# Patient Record
Sex: Male | Born: 1965
Health system: Southern US, Community
[De-identification: ages and names within clinical notes are randomized; demographics above are authoritative.]

## PROBLEM LIST (undated history)

## (undated) HISTORY — PX: OTHER SURGICAL HISTORY: SHX169

---

## 2006-06-16 ENCOUNTER — Emergency Department: Payer: Self-pay | Admitting: Emergency Medicine

## 2007-05-19 ENCOUNTER — Emergency Department: Payer: Self-pay

## 2008-11-18 ENCOUNTER — Ambulatory Visit: Payer: Self-pay | Admitting: Internal Medicine

## 2010-10-02 IMAGING — CR RIGHT HAND - 2 VIEW
1 series · 2 of 2 positions shown · non-contrast
Comparison: none

REASON FOR EXAM: foreign body
COMMENTS:

PROCEDURE:     MDR - MDR HAND RT TWO VIEWS  - November 18, 2008 [DATE]
RESULT:     No fracture, dislocation, or other acute bony normality is
identified. No radiodense soft tissue foreign body is seen.

[Series 1: view not recorded · 0.17mm/px · 2 of 2 slices shown]
[im 1/2]
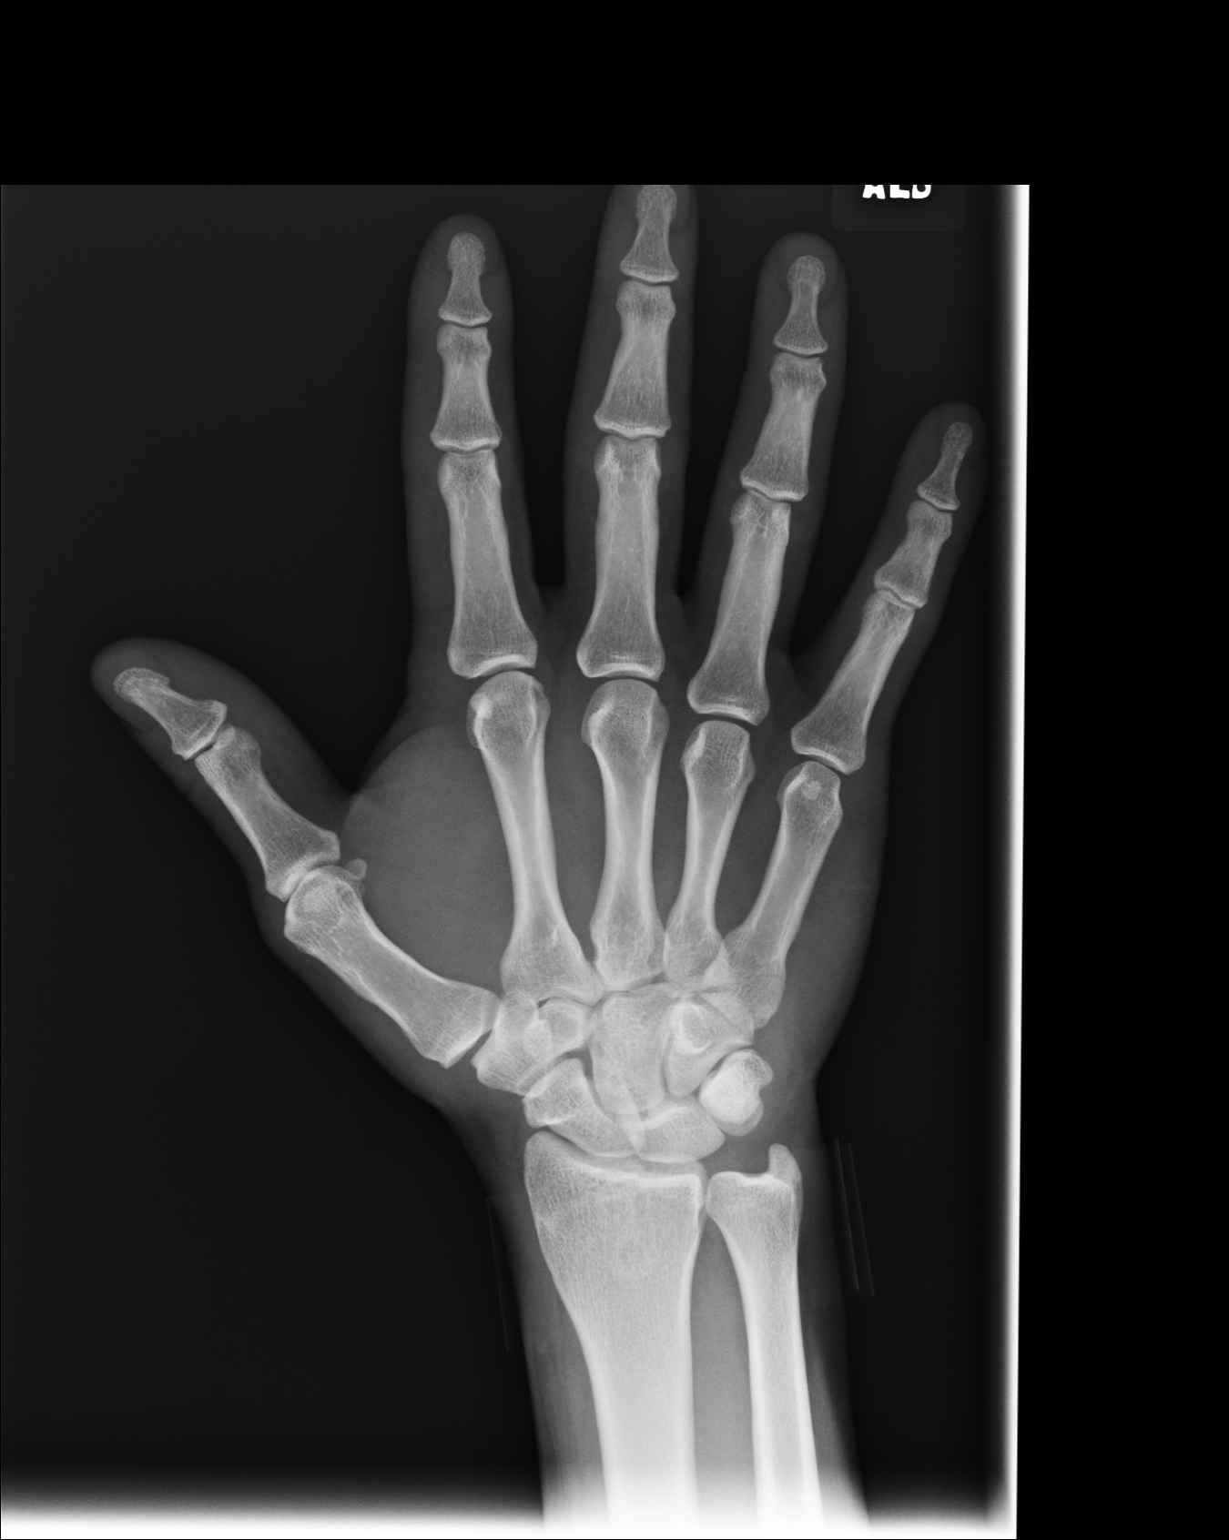
[im 2/2]
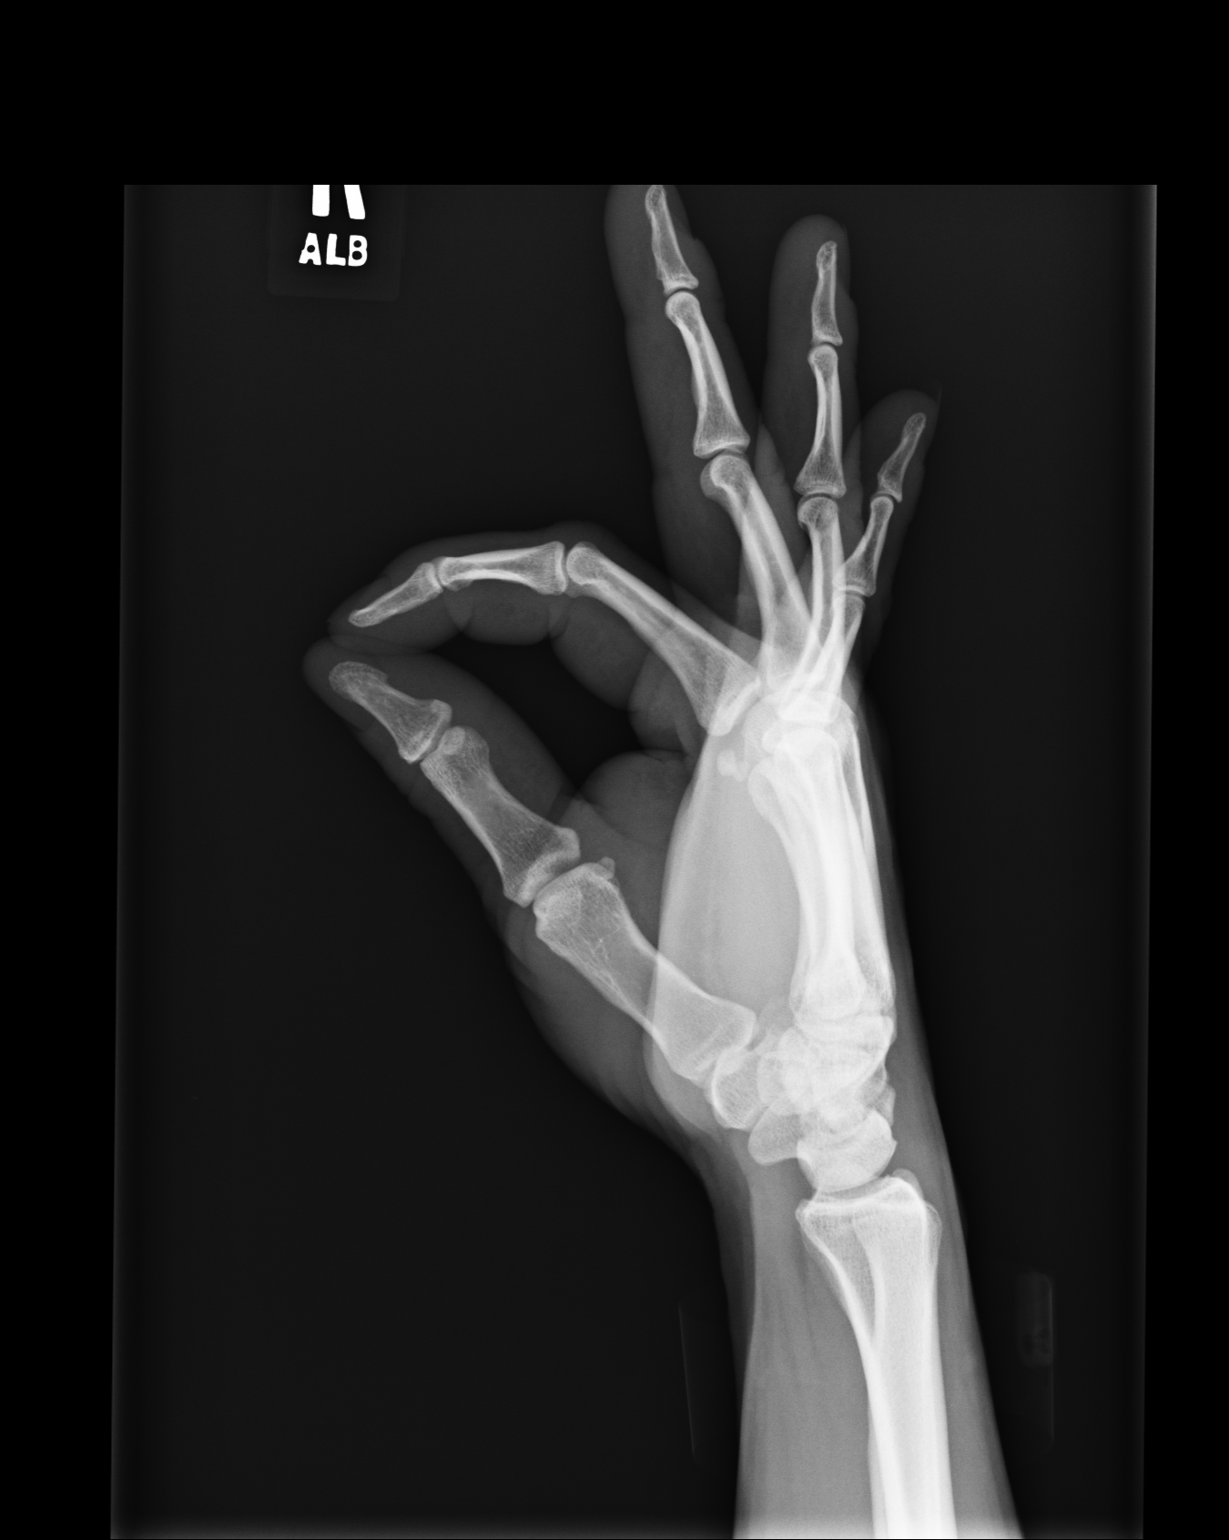

[2 of 2 positions shown; findings below may reference images not displayed]

IMPRESSION: 1. No significant abnormalities are noted.

## 2016-09-27 DIAGNOSIS — H524 Presbyopia: Secondary | ICD-10-CM | POA: Diagnosis not present

## 2016-09-27 DIAGNOSIS — H52223 Regular astigmatism, bilateral: Secondary | ICD-10-CM | POA: Diagnosis not present

## 2016-10-21 DIAGNOSIS — Z13220 Encounter for screening for lipoid disorders: Secondary | ICD-10-CM | POA: Diagnosis not present

## 2016-10-21 DIAGNOSIS — Z0000 Encounter for general adult medical examination without abnormal findings: Secondary | ICD-10-CM | POA: Diagnosis not present

## 2016-10-21 DIAGNOSIS — Z131 Encounter for screening for diabetes mellitus: Secondary | ICD-10-CM | POA: Diagnosis not present

## 2016-10-21 DIAGNOSIS — B351 Tinea unguium: Secondary | ICD-10-CM | POA: Diagnosis not present

## 2017-01-24 DIAGNOSIS — B351 Tinea unguium: Secondary | ICD-10-CM | POA: Diagnosis not present

## 2017-09-21 DIAGNOSIS — H524 Presbyopia: Secondary | ICD-10-CM | POA: Diagnosis not present

## 2017-09-21 DIAGNOSIS — H1131 Conjunctival hemorrhage, right eye: Secondary | ICD-10-CM | POA: Diagnosis not present

## 2017-09-21 DIAGNOSIS — H11151 Pinguecula, right eye: Secondary | ICD-10-CM | POA: Diagnosis not present

## 2018-09-20 DIAGNOSIS — H11151 Pinguecula, right eye: Secondary | ICD-10-CM | POA: Diagnosis not present

## 2018-09-20 DIAGNOSIS — H524 Presbyopia: Secondary | ICD-10-CM | POA: Diagnosis not present

## 2019-07-20 DIAGNOSIS — K56609 Unspecified intestinal obstruction, unspecified as to partial versus complete obstruction: Secondary | ICD-10-CM | POA: Diagnosis not present

## 2019-07-21 DIAGNOSIS — R109 Unspecified abdominal pain: Secondary | ICD-10-CM | POA: Diagnosis not present

## 2019-07-21 DIAGNOSIS — K56609 Unspecified intestinal obstruction, unspecified as to partial versus complete obstruction: Secondary | ICD-10-CM | POA: Diagnosis not present

## 2019-07-21 DIAGNOSIS — I10 Essential (primary) hypertension: Secondary | ICD-10-CM | POA: Diagnosis not present

## 2019-07-22 DIAGNOSIS — K56609 Unspecified intestinal obstruction, unspecified as to partial versus complete obstruction: Secondary | ICD-10-CM | POA: Diagnosis not present

## 2019-07-22 DIAGNOSIS — I10 Essential (primary) hypertension: Secondary | ICD-10-CM | POA: Diagnosis not present

## 2019-07-22 DIAGNOSIS — R109 Unspecified abdominal pain: Secondary | ICD-10-CM | POA: Diagnosis not present

## 2019-09-17 DIAGNOSIS — H11151 Pinguecula, right eye: Secondary | ICD-10-CM | POA: Diagnosis not present

## 2019-09-17 DIAGNOSIS — H5212 Myopia, left eye: Secondary | ICD-10-CM | POA: Diagnosis not present

## 2019-09-17 DIAGNOSIS — H524 Presbyopia: Secondary | ICD-10-CM | POA: Diagnosis not present

## 2020-07-21 ENCOUNTER — Other Ambulatory Visit: Payer: Self-pay | Admitting: Internal Medicine

## 2020-07-21 ENCOUNTER — Ambulatory Visit: Payer: Self-pay | Attending: Internal Medicine

## 2020-07-21 DIAGNOSIS — Z23 Encounter for immunization: Secondary | ICD-10-CM

## 2020-07-21 NOTE — Progress Notes (Signed)
   Covid-19 Vaccination Clinic  Name:  Zachary Chen    MRN: [id number] DOB: 11-Feb-1966  07/21/2020  Mr. Zachary Chen was observed post Covid-19 immunization for 15 minutes without incident. He was provided with Vaccine Information Sheet and instruction to access the V-Safe system.   Mr. Zachary Chen was instructed to call 911 with any severe reactions post vaccine: Marland Kitchen Difficulty breathing  . Swelling of face and throat  . A fast heartbeat  . A bad rash all over body  . Dizziness and weakness   Immunizations Administered    Name Date Dose VIS Date Route   Pfizer COVID-19 Vaccine 07/21/2020  9:13 AM 0.3 mL 12/11/2018 Intramuscular   Manufacturer: [employer], [employer]   Lot: [id number]   NDC: [address]-4

## 2020-08-11 ENCOUNTER — Other Ambulatory Visit: Payer: Self-pay | Admitting: Internal Medicine

## 2020-08-11 ENCOUNTER — Ambulatory Visit: Payer: Self-pay | Attending: Internal Medicine

## 2020-08-11 DIAGNOSIS — Z23 Encounter for immunization: Secondary | ICD-10-CM

## 2020-08-11 NOTE — Progress Notes (Signed)
   Covid-19 Vaccination Clinic  Name:  Zachary Chen    MRN: [id number] DOB: 1965-11-22  08/11/2020  [name] was observed post Covid-19 immunization for 15 minutes without incident. He was provided with Vaccine Information Sheet and instruction to access the V-Safe system.   [name] was instructed to call 911 with any severe reactions post vaccine: Marland Kitchen Difficulty breathing  . Swelling of face and throat  . A fast heartbeat  . A bad rash all over body  . Dizziness and weakness   Immunizations Administered    Name Date Dose VIS Date Route   Pfizer COVID-19 Vaccine 08/11/2020  9:25 AM 0.3 mL 12/11/2018 Intramuscular   Manufacturer: [employer], [employer]   Lot: [id number]   NDC: [address]-5

## 2020-09-17 DIAGNOSIS — H11153 Pinguecula, bilateral: Secondary | ICD-10-CM | POA: Diagnosis not present

## 2020-09-17 DIAGNOSIS — H524 Presbyopia: Secondary | ICD-10-CM | POA: Diagnosis not present

## 2020-09-17 DIAGNOSIS — H5212 Myopia, left eye: Secondary | ICD-10-CM | POA: Diagnosis not present

## 2021-09-16 DIAGNOSIS — H5201 Hypermetropia, right eye: Secondary | ICD-10-CM | POA: Diagnosis not present

## 2021-09-16 DIAGNOSIS — H524 Presbyopia: Secondary | ICD-10-CM | POA: Diagnosis not present

## 2021-09-16 DIAGNOSIS — H5212 Myopia, left eye: Secondary | ICD-10-CM | POA: Diagnosis not present

## 2021-09-16 DIAGNOSIS — H11153 Pinguecula, bilateral: Secondary | ICD-10-CM | POA: Diagnosis not present

## 2022-06-15 DIAGNOSIS — M542 Cervicalgia: Secondary | ICD-10-CM | POA: Diagnosis not present

## 2022-06-15 DIAGNOSIS — M5412 Radiculopathy, cervical region: Secondary | ICD-10-CM | POA: Diagnosis not present

## 2022-06-15 DIAGNOSIS — M9901 Segmental and somatic dysfunction of cervical region: Secondary | ICD-10-CM | POA: Diagnosis not present

## 2022-06-15 DIAGNOSIS — M62830 Muscle spasm of back: Secondary | ICD-10-CM | POA: Diagnosis not present

## 2022-06-16 DIAGNOSIS — M62830 Muscle spasm of back: Secondary | ICD-10-CM | POA: Diagnosis not present

## 2022-06-16 DIAGNOSIS — M542 Cervicalgia: Secondary | ICD-10-CM | POA: Diagnosis not present

## 2022-06-16 DIAGNOSIS — M5412 Radiculopathy, cervical region: Secondary | ICD-10-CM | POA: Diagnosis not present

## 2022-06-16 DIAGNOSIS — M9901 Segmental and somatic dysfunction of cervical region: Secondary | ICD-10-CM | POA: Diagnosis not present

## 2022-06-17 DIAGNOSIS — M5412 Radiculopathy, cervical region: Secondary | ICD-10-CM | POA: Diagnosis not present

## 2022-06-17 DIAGNOSIS — M9901 Segmental and somatic dysfunction of cervical region: Secondary | ICD-10-CM | POA: Diagnosis not present

## 2022-06-17 DIAGNOSIS — M62830 Muscle spasm of back: Secondary | ICD-10-CM | POA: Diagnosis not present

## 2022-06-17 DIAGNOSIS — M542 Cervicalgia: Secondary | ICD-10-CM | POA: Diagnosis not present

## 2022-06-21 DIAGNOSIS — M62830 Muscle spasm of back: Secondary | ICD-10-CM | POA: Diagnosis not present

## 2022-06-21 DIAGNOSIS — M542 Cervicalgia: Secondary | ICD-10-CM | POA: Diagnosis not present

## 2022-06-21 DIAGNOSIS — M9901 Segmental and somatic dysfunction of cervical region: Secondary | ICD-10-CM | POA: Diagnosis not present

## 2022-06-21 DIAGNOSIS — M5412 Radiculopathy, cervical region: Secondary | ICD-10-CM | POA: Diagnosis not present

## 2022-06-22 DIAGNOSIS — M542 Cervicalgia: Secondary | ICD-10-CM | POA: Diagnosis not present

## 2022-06-22 DIAGNOSIS — M9901 Segmental and somatic dysfunction of cervical region: Secondary | ICD-10-CM | POA: Diagnosis not present

## 2022-06-22 DIAGNOSIS — M62830 Muscle spasm of back: Secondary | ICD-10-CM | POA: Diagnosis not present

## 2022-06-22 DIAGNOSIS — M5412 Radiculopathy, cervical region: Secondary | ICD-10-CM | POA: Diagnosis not present

## 2022-06-24 DIAGNOSIS — M542 Cervicalgia: Secondary | ICD-10-CM | POA: Diagnosis not present

## 2022-06-24 DIAGNOSIS — M62830 Muscle spasm of back: Secondary | ICD-10-CM | POA: Diagnosis not present

## 2022-06-24 DIAGNOSIS — M5412 Radiculopathy, cervical region: Secondary | ICD-10-CM | POA: Diagnosis not present

## 2022-06-24 DIAGNOSIS — M9901 Segmental and somatic dysfunction of cervical region: Secondary | ICD-10-CM | POA: Diagnosis not present

## 2022-06-27 DIAGNOSIS — M62830 Muscle spasm of back: Secondary | ICD-10-CM | POA: Diagnosis not present

## 2022-06-27 DIAGNOSIS — M9901 Segmental and somatic dysfunction of cervical region: Secondary | ICD-10-CM | POA: Diagnosis not present

## 2022-06-27 DIAGNOSIS — M542 Cervicalgia: Secondary | ICD-10-CM | POA: Diagnosis not present

## 2022-06-27 DIAGNOSIS — M5412 Radiculopathy, cervical region: Secondary | ICD-10-CM | POA: Diagnosis not present

## 2022-06-29 DIAGNOSIS — M9901 Segmental and somatic dysfunction of cervical region: Secondary | ICD-10-CM | POA: Diagnosis not present

## 2022-06-29 DIAGNOSIS — M5412 Radiculopathy, cervical region: Secondary | ICD-10-CM | POA: Diagnosis not present

## 2022-06-29 DIAGNOSIS — M62830 Muscle spasm of back: Secondary | ICD-10-CM | POA: Diagnosis not present

## 2022-06-29 DIAGNOSIS — M542 Cervicalgia: Secondary | ICD-10-CM | POA: Diagnosis not present

## 2022-07-01 DIAGNOSIS — S0181XA Laceration without foreign body of other part of head, initial encounter: Secondary | ICD-10-CM | POA: Diagnosis not present

## 2022-07-01 DIAGNOSIS — S50311A Abrasion of right elbow, initial encounter: Secondary | ICD-10-CM | POA: Diagnosis not present

## 2022-07-01 DIAGNOSIS — R000 Tachycardia, unspecified: Secondary | ICD-10-CM | POA: Diagnosis not present

## 2022-07-01 DIAGNOSIS — R220 Localized swelling, mass and lump, head: Secondary | ICD-10-CM | POA: Diagnosis not present

## 2022-07-05 DIAGNOSIS — M62830 Muscle spasm of back: Secondary | ICD-10-CM | POA: Diagnosis not present

## 2022-07-05 DIAGNOSIS — M5412 Radiculopathy, cervical region: Secondary | ICD-10-CM | POA: Diagnosis not present

## 2022-07-05 DIAGNOSIS — M9901 Segmental and somatic dysfunction of cervical region: Secondary | ICD-10-CM | POA: Diagnosis not present

## 2022-07-05 DIAGNOSIS — M542 Cervicalgia: Secondary | ICD-10-CM | POA: Diagnosis not present

## 2022-07-06 DIAGNOSIS — M62830 Muscle spasm of back: Secondary | ICD-10-CM | POA: Diagnosis not present

## 2022-07-06 DIAGNOSIS — M9901 Segmental and somatic dysfunction of cervical region: Secondary | ICD-10-CM | POA: Diagnosis not present

## 2022-07-06 DIAGNOSIS — M542 Cervicalgia: Secondary | ICD-10-CM | POA: Diagnosis not present

## 2022-07-06 DIAGNOSIS — M5412 Radiculopathy, cervical region: Secondary | ICD-10-CM | POA: Diagnosis not present

## 2022-07-08 DIAGNOSIS — M5412 Radiculopathy, cervical region: Secondary | ICD-10-CM | POA: Diagnosis not present

## 2022-07-08 DIAGNOSIS — M62830 Muscle spasm of back: Secondary | ICD-10-CM | POA: Diagnosis not present

## 2022-07-08 DIAGNOSIS — M542 Cervicalgia: Secondary | ICD-10-CM | POA: Diagnosis not present

## 2022-07-08 DIAGNOSIS — M9901 Segmental and somatic dysfunction of cervical region: Secondary | ICD-10-CM | POA: Diagnosis not present

## 2022-07-12 DIAGNOSIS — M5412 Radiculopathy, cervical region: Secondary | ICD-10-CM | POA: Diagnosis not present

## 2022-07-12 DIAGNOSIS — M9901 Segmental and somatic dysfunction of cervical region: Secondary | ICD-10-CM | POA: Diagnosis not present

## 2022-07-12 DIAGNOSIS — M542 Cervicalgia: Secondary | ICD-10-CM | POA: Diagnosis not present

## 2022-07-12 DIAGNOSIS — M62830 Muscle spasm of back: Secondary | ICD-10-CM | POA: Diagnosis not present

## 2022-07-14 DIAGNOSIS — M5412 Radiculopathy, cervical region: Secondary | ICD-10-CM | POA: Diagnosis not present

## 2022-07-14 DIAGNOSIS — M62830 Muscle spasm of back: Secondary | ICD-10-CM | POA: Diagnosis not present

## 2022-07-14 DIAGNOSIS — M542 Cervicalgia: Secondary | ICD-10-CM | POA: Diagnosis not present

## 2022-07-14 DIAGNOSIS — M9901 Segmental and somatic dysfunction of cervical region: Secondary | ICD-10-CM | POA: Diagnosis not present

## 2022-07-25 DIAGNOSIS — M62830 Muscle spasm of back: Secondary | ICD-10-CM | POA: Diagnosis not present

## 2022-07-25 DIAGNOSIS — M542 Cervicalgia: Secondary | ICD-10-CM | POA: Diagnosis not present

## 2022-07-25 DIAGNOSIS — M9901 Segmental and somatic dysfunction of cervical region: Secondary | ICD-10-CM | POA: Diagnosis not present

## 2022-07-25 DIAGNOSIS — M5412 Radiculopathy, cervical region: Secondary | ICD-10-CM | POA: Diagnosis not present

## 2022-08-01 DIAGNOSIS — M5412 Radiculopathy, cervical region: Secondary | ICD-10-CM | POA: Diagnosis not present

## 2022-08-01 DIAGNOSIS — M62830 Muscle spasm of back: Secondary | ICD-10-CM | POA: Diagnosis not present

## 2022-08-01 DIAGNOSIS — M9901 Segmental and somatic dysfunction of cervical region: Secondary | ICD-10-CM | POA: Diagnosis not present

## 2022-08-01 DIAGNOSIS — M542 Cervicalgia: Secondary | ICD-10-CM | POA: Diagnosis not present

## 2022-08-04 DIAGNOSIS — M5412 Radiculopathy, cervical region: Secondary | ICD-10-CM | POA: Diagnosis not present

## 2022-08-04 DIAGNOSIS — M9901 Segmental and somatic dysfunction of cervical region: Secondary | ICD-10-CM | POA: Diagnosis not present

## 2022-08-04 DIAGNOSIS — M62830 Muscle spasm of back: Secondary | ICD-10-CM | POA: Diagnosis not present

## 2022-08-04 DIAGNOSIS — M542 Cervicalgia: Secondary | ICD-10-CM | POA: Diagnosis not present

## 2022-08-08 DIAGNOSIS — M62830 Muscle spasm of back: Secondary | ICD-10-CM | POA: Diagnosis not present

## 2022-08-08 DIAGNOSIS — M542 Cervicalgia: Secondary | ICD-10-CM | POA: Diagnosis not present

## 2022-08-08 DIAGNOSIS — M5412 Radiculopathy, cervical region: Secondary | ICD-10-CM | POA: Diagnosis not present

## 2022-08-08 DIAGNOSIS — M9901 Segmental and somatic dysfunction of cervical region: Secondary | ICD-10-CM | POA: Diagnosis not present

## 2022-08-22 DIAGNOSIS — M5412 Radiculopathy, cervical region: Secondary | ICD-10-CM | POA: Diagnosis not present

## 2022-08-22 DIAGNOSIS — M62830 Muscle spasm of back: Secondary | ICD-10-CM | POA: Diagnosis not present

## 2022-08-22 DIAGNOSIS — M9901 Segmental and somatic dysfunction of cervical region: Secondary | ICD-10-CM | POA: Diagnosis not present

## 2022-08-22 DIAGNOSIS — M542 Cervicalgia: Secondary | ICD-10-CM | POA: Diagnosis not present

## 2022-08-23 DIAGNOSIS — H5201 Hypermetropia, right eye: Secondary | ICD-10-CM | POA: Diagnosis not present

## 2022-08-23 DIAGNOSIS — H11153 Pinguecula, bilateral: Secondary | ICD-10-CM | POA: Diagnosis not present

## 2022-08-23 DIAGNOSIS — H5212 Myopia, left eye: Secondary | ICD-10-CM | POA: Diagnosis not present

## 2022-08-23 DIAGNOSIS — H524 Presbyopia: Secondary | ICD-10-CM | POA: Diagnosis not present

## 2022-09-05 DIAGNOSIS — M62830 Muscle spasm of back: Secondary | ICD-10-CM | POA: Diagnosis not present

## 2022-09-05 DIAGNOSIS — M542 Cervicalgia: Secondary | ICD-10-CM | POA: Diagnosis not present

## 2022-09-05 DIAGNOSIS — M9901 Segmental and somatic dysfunction of cervical region: Secondary | ICD-10-CM | POA: Diagnosis not present

## 2022-09-05 DIAGNOSIS — M5412 Radiculopathy, cervical region: Secondary | ICD-10-CM | POA: Diagnosis not present

## 2022-09-19 DIAGNOSIS — M542 Cervicalgia: Secondary | ICD-10-CM | POA: Diagnosis not present

## 2022-09-19 DIAGNOSIS — M9901 Segmental and somatic dysfunction of cervical region: Secondary | ICD-10-CM | POA: Diagnosis not present

## 2022-09-19 DIAGNOSIS — M62830 Muscle spasm of back: Secondary | ICD-10-CM | POA: Diagnosis not present

## 2022-09-19 DIAGNOSIS — M5412 Radiculopathy, cervical region: Secondary | ICD-10-CM | POA: Diagnosis not present

## 2022-09-29 DIAGNOSIS — M62830 Muscle spasm of back: Secondary | ICD-10-CM | POA: Diagnosis not present

## 2022-09-29 DIAGNOSIS — M542 Cervicalgia: Secondary | ICD-10-CM | POA: Diagnosis not present

## 2022-09-29 DIAGNOSIS — M5412 Radiculopathy, cervical region: Secondary | ICD-10-CM | POA: Diagnosis not present

## 2022-09-29 DIAGNOSIS — M9901 Segmental and somatic dysfunction of cervical region: Secondary | ICD-10-CM | POA: Diagnosis not present

## 2023-10-31 ENCOUNTER — Encounter: Payer: Self-pay | Admitting: Internal Medicine

## 2023-10-31 ENCOUNTER — Ambulatory Visit: Payer: Commercial Managed Care - PPO | Admitting: Internal Medicine

## 2023-10-31 VITALS — BP 120/80 | HR 58 | Temp 97.2°F | Ht 66.0 in | Wt 180.6 lb

## 2023-10-31 DIAGNOSIS — Z0001 Encounter for general adult medical examination with abnormal findings: Secondary | ICD-10-CM

## 2023-10-31 DIAGNOSIS — Z1211 Encounter for screening for malignant neoplasm of colon: Secondary | ICD-10-CM | POA: Diagnosis not present

## 2023-10-31 DIAGNOSIS — N400 Enlarged prostate without lower urinary tract symptoms: Secondary | ICD-10-CM

## 2023-10-31 DIAGNOSIS — Z0130 Encounter for examination of blood pressure without abnormal findings: Secondary | ICD-10-CM

## 2023-10-31 NOTE — Progress Notes (Signed)
 New Patient Office Visit  Subjective:  Patient ID: Zachary Chen, male    DOB: 1966/09/03  Age: 58 y.o. MRN: [id number]  Chief Complaint  Patient presents with   Establish Care    No complaints, here to establish IM care. Hasn't seen a pcp in 7 years but was not on prescription rx then.     No other concerns at this time.   History reviewed. No pertinent past medical history.  Past Surgical History:  Procedure Laterality Date   partial amputation of left finger Left     Social History   Socioeconomic History   Marital status: Married    Spouse name: Not on file   Number of children: Not on file   Years of education: Not on file   Highest education level: Not on file  Occupational History   Occupation: corporate investment banker  Tobacco Use   Smoking status: Never   Smokeless tobacco: Never  Vaping Use   Vaping status: Never Used  Substance and Sexual Activity   Alcohol use: Yes    Comment: occasionally   Drug use: Never   Sexual activity: Yes  Other Topics Concern   Not on file  Social History Narrative   Not on file   Social Drivers of Health   Financial Resource Strain: Not on file  Food Insecurity: Not on file  Transportation Needs: Not on file  Physical Activity: Not on file  Stress: Not on file  Social Connections: Not on file  Intimate Partner Violence: Not on file    History reviewed. No pertinent family history.  Not on File  No outpatient medications prior to visit.   No facility-administered medications prior to visit.    Review of Systems  Constitutional:  Negative for malaise/fatigue and weight loss.  HENT: Negative.    Eyes:  Negative for blurred vision (except near vision).  Respiratory: Negative.    Cardiovascular: Negative.  Negative for chest pain.  Gastrointestinal: Negative.   Genitourinary: Negative.   Musculoskeletal:  Positive for joint pain (both shoulders).  Skin:        Skin tags  Neurological: Negative.    Psychiatric/Behavioral: Negative.         Objective:   BP 120/80   Pulse (!) 58   Temp (!) 97.2 F (36.2 C)   Ht 5' 6 (1.676 m)   Wt 180 lb 9.6 oz (81.9 kg)   SpO2 95%   BMI 29.15 kg/m   Vitals:   10/31/23 0943  BP: 120/80  Pulse: (!) 58  Temp: (!) 97.2 F (36.2 C)  Height: 5' 6 (1.676 m)  Weight: 180 lb 9.6 oz (81.9 kg)  SpO2: 95%  BMI (Calculated): 29.16    Physical Exam Vitals reviewed.  Constitutional:      Appearance: Normal appearance.  HENT:     Head: Normocephalic.     Left Ear: There is no impacted cerumen.     Nose: Nose normal.     Mouth/Throat:     Mouth: Mucous membranes are moist.     Pharynx: No posterior oropharyngeal erythema.  Eyes:     Extraocular Movements: Extraocular movements intact.     Pupils: Pupils are equal, round, and reactive to light.  Cardiovascular:     Rate and Rhythm: Regular rhythm.     Chest Wall: PMI is not displaced.     Pulses: Normal pulses.     Heart sounds: Normal heart sounds. No murmur heard. Pulmonary:     Effort:  Pulmonary effort is normal.     Breath sounds: Normal air entry. No rhonchi or rales.  Abdominal:     General: Abdomen is flat. Bowel sounds are normal. There is no distension.     Palpations: Abdomen is soft. There is no hepatomegaly, splenomegaly or mass.     Tenderness: There is no abdominal tenderness.  Musculoskeletal:        General: Normal range of motion.     Cervical back: Normal range of motion and neck supple.     Right lower leg: No edema.     Left lower leg: No edema.  Skin:    General: Skin is warm and dry.  Neurological:     General: No focal deficit present.     Mental Status: He is alert and oriented to person, place, and time.     Cranial Nerves: No cranial nerve deficit.     Motor: No weakness.     Deep Tendon Reflexes: Reflexes are normal and symmetric.  Psychiatric:        Mood and Affect: Mood normal.        Behavior: Behavior normal.      No results found for  any visits on 10/31/23.  No results found for this or any previous visit (from the past 2160 hours).    Assessment & Plan:  As per problem list.  Problem List Items Addressed This Visit   None Visit Diagnoses       Encounter for routine adult health examination with abnormal findings    -  Primary   Relevant Orders   Lipid panel   Comprehensive metabolic panel   CBC With Diff/Platelet   POC Hemoccult Bld/Stl (1-Cd Office Dx)     Benign prostatic hyperplasia without lower urinary tract symptoms       Relevant Orders   PSA     Colon cancer screening       Relevant Orders   Ambulatory referral to Gastroenterology       Return in about 3 weeks (around 11/21/2023) for cpe with labs prior.   Total time spent: 40 minutes  Sherrill Cinderella Perry, MD  10/31/2023   This document may have been prepared by [facility] Voice Recognition software and as such may include unintentional dictation errors.

## 2023-11-02 DIAGNOSIS — M542 Cervicalgia: Secondary | ICD-10-CM | POA: Diagnosis not present

## 2023-11-02 DIAGNOSIS — M62830 Muscle spasm of back: Secondary | ICD-10-CM | POA: Diagnosis not present

## 2023-11-02 DIAGNOSIS — M5412 Radiculopathy, cervical region: Secondary | ICD-10-CM | POA: Diagnosis not present

## 2023-11-02 DIAGNOSIS — M9901 Segmental and somatic dysfunction of cervical region: Secondary | ICD-10-CM | POA: Diagnosis not present

## 2023-11-20 DIAGNOSIS — M542 Cervicalgia: Secondary | ICD-10-CM | POA: Diagnosis not present

## 2023-11-20 DIAGNOSIS — M62830 Muscle spasm of back: Secondary | ICD-10-CM | POA: Diagnosis not present

## 2023-11-20 DIAGNOSIS — M5412 Radiculopathy, cervical region: Secondary | ICD-10-CM | POA: Diagnosis not present

## 2023-11-20 DIAGNOSIS — M9901 Segmental and somatic dysfunction of cervical region: Secondary | ICD-10-CM | POA: Diagnosis not present

## 2023-11-27 ENCOUNTER — Encounter: Payer: Commercial Managed Care - PPO | Admitting: Internal Medicine

## 2023-12-04 ENCOUNTER — Encounter: Payer: Self-pay | Admitting: Internal Medicine

## 2023-12-04 ENCOUNTER — Ambulatory Visit (INDEPENDENT_AMBULATORY_CARE_PROVIDER_SITE_OTHER): Payer: Commercial Managed Care - PPO | Admitting: Internal Medicine

## 2023-12-04 VITALS — BP 116/70 | HR 81 | Temp 97.3°F | Ht 66.0 in | Wt 180.0 lb

## 2023-12-04 DIAGNOSIS — M25511 Pain in right shoulder: Secondary | ICD-10-CM | POA: Insufficient documentation

## 2023-12-04 DIAGNOSIS — B351 Tinea unguium: Secondary | ICD-10-CM | POA: Insufficient documentation

## 2023-12-04 DIAGNOSIS — Z0001 Encounter for general adult medical examination with abnormal findings: Secondary | ICD-10-CM | POA: Diagnosis not present

## 2023-12-04 DIAGNOSIS — Z0130 Encounter for examination of blood pressure without abnormal findings: Secondary | ICD-10-CM

## 2023-12-04 DIAGNOSIS — M25512 Pain in left shoulder: Secondary | ICD-10-CM

## 2023-12-04 MED ORDER — DICLOFENAC SODIUM 1 % EX GEL: 2.0000 g | Freq: Two times a day (BID) | CUTANEOUS | 1 refills | Status: AC

## 2023-12-04 NOTE — Progress Notes (Signed)
 Established Patient Office Visit  Subjective:  Patient ID: Zachary Chen, male    DOB: [birth year]  Age: 58 y.o. MRN: [id number]  Chief Complaint  Patient presents with   Annual Exam    3 week follow up  CPE    No new complaints, here for CPE, lab review and medication refills. Failed to have previsit labs done.    No other concerns at this time.   No past medical history on file.  Past Surgical History:  Procedure Laterality Date   partial amputation of left finger Left     Social History   Socioeconomic History   Marital status: Married    Spouse name: Not on file   Number of children: Not on file   Years of education: Not on file   Highest education level: Not on file  Occupational History   Occupation: Corporate investment banker  Tobacco Use   Smoking status: Never   Smokeless tobacco: Never  Vaping Use   Vaping status: Never Used  Substance and Sexual Activity   Alcohol use: Yes    Comment: occasionally   Drug use: Never   Sexual activity: Yes  Other Topics Concern   Not on file  Social History Narrative   Not on file   Social Drivers of Health   Financial Resource Strain: Not on file  Food Insecurity: Not on file  Transportation Needs: Not on file  Physical Activity: Not on file  Stress: Not on file  Social Connections: Not on file  Intimate Partner Violence: Not on file    No family history on file.  Not on File  No outpatient medications prior to visit.   No facility-administered medications prior to visit.    Review of Systems  Constitutional:  Negative for malaise/fatigue and weight loss.  HENT: Negative.    Eyes:  Negative for blurred vision (except near vision).  Respiratory: Negative.    Cardiovascular: Negative.  Negative for chest pain.  Gastrointestinal: Negative.   Genitourinary: Negative.   Musculoskeletal:  Positive for joint pain (both shoulders).  Skin:        Skin tags  Neurological: Negative.   Psychiatric/Behavioral:  Negative.         Objective:   BP 116/70   Pulse 81   Temp (!) 97.3 F (36.3 C)   Ht 5\' 6"  (1.676 m)   Wt 180 lb (81.6 kg)   SpO2 98%   BMI 29.05 kg/m   Vitals:   12/04/23 1419  BP: 116/70  Pulse: 81  Temp: (!) 97.3 F (36.3 C)  Height: 5\' 6"  (1.676 m)  Weight: 180 lb (81.6 kg)  SpO2: 98%  BMI (Calculated): 29.07    Physical Exam Vitals reviewed.  Constitutional:      Appearance: Normal appearance.  HENT:     Head: Normocephalic.     Left Ear: There is no impacted cerumen.     Nose: Nose normal.     Mouth/Throat:     Mouth: Mucous membranes are moist.     Pharynx: No posterior oropharyngeal erythema.  Eyes:     Extraocular Movements: Extraocular movements intact.     Pupils: Pupils are equal, round, and reactive to light.  Cardiovascular:     Rate and Rhythm: Regular rhythm.     Chest Wall: PMI is not displaced.     Pulses: Normal pulses.     Heart sounds: Normal heart sounds. No murmur heard. Pulmonary:     Effort: Pulmonary effort is normal.  Breath sounds: Normal air entry. No rhonchi or rales.  Abdominal:     General: Abdomen is flat. Bowel sounds are normal. There is no distension.     Palpations: Abdomen is soft. There is no hepatomegaly, splenomegaly or mass.     Tenderness: There is no abdominal tenderness.  Musculoskeletal:        General: Normal range of motion.     Cervical back: Normal range of motion and neck supple.     Right lower leg: No edema.     Left lower leg: No edema.  Skin:    General: Skin is warm and dry.     Comments: Onychomycosis of left big toenail  Neurological:     General: No focal deficit present.     Mental Status: He is alert and oriented to person, place, and time.     Cranial Nerves: No cranial nerve deficit.     Motor: No weakness.     Deep Tendon Reflexes: Reflexes are normal and symmetric.  Psychiatric:        Mood and Affect: Mood normal.        Behavior: Behavior normal.      No results found for  any visits on 12/04/23.  No results found for this or any previous visit (from the past 2160 hours).    Assessment & Plan:  As per problem list. Order Terbinafine after CMP results are available.  Problem List Items Addressed This Visit       Musculoskeletal and Integument   Onychomycosis     Other   Pain of both shoulder joints   Relevant Medications   diclofenac Sodium (VOLTAREN) 1 % GEL   Other Visit Diagnoses       Encounter for routine adult health examination with abnormal findings    -  Primary     Encounter for general adult medical examination with abnormal findings           Return in 2 weeks (on 12/18/2023) for fu with labs prior.   Total time spent: 30 minutes  Luna Fuse, MD  12/04/2023   This document may have been prepared by [facility] Voice Recognition software and as such may include unintentional dictation errors.

## 2023-12-18 ENCOUNTER — Ambulatory Visit: Payer: Commercial Managed Care - PPO | Admitting: Internal Medicine

## 2023-12-18 ENCOUNTER — Other Ambulatory Visit

## 2023-12-18 DIAGNOSIS — N400 Enlarged prostate without lower urinary tract symptoms: Secondary | ICD-10-CM | POA: Diagnosis not present

## 2023-12-18 DIAGNOSIS — Z0001 Encounter for general adult medical examination with abnormal findings: Secondary | ICD-10-CM | POA: Diagnosis not present

## 2023-12-19 ENCOUNTER — Encounter: Payer: Self-pay | Admitting: Internal Medicine

## 2023-12-19 ENCOUNTER — Ambulatory Visit: Admitting: Internal Medicine

## 2023-12-19 VITALS — BP 122/78 | HR 75 | Temp 98.5°F | Ht 66.0 in | Wt 179.0 lb

## 2023-12-19 DIAGNOSIS — D696 Thrombocytopenia, unspecified: Secondary | ICD-10-CM

## 2023-12-19 DIAGNOSIS — E782 Mixed hyperlipidemia: Secondary | ICD-10-CM | POA: Diagnosis not present

## 2023-12-19 DIAGNOSIS — Z0130 Encounter for examination of blood pressure without abnormal findings: Secondary | ICD-10-CM

## 2023-12-19 DIAGNOSIS — E7800 Pure hypercholesterolemia, unspecified: Secondary | ICD-10-CM | POA: Insufficient documentation

## 2023-12-19 LAB — COMPREHENSIVE METABOLIC PANEL
ALT: 29 IU/L (ref 0–44)
AST: 29 IU/L (ref 0–40)
Albumin: 4.4 g/dL (ref 3.8–4.9)
Alkaline Phosphatase: 67 IU/L (ref 44–121)
BUN/Creatinine Ratio: 17 (ref 9–20)
BUN: 20 mg/dL (ref 6–24)
Bilirubin Total: 0.6 mg/dL (ref 0.0–1.2)
CO2: 22 mmol/L (ref 20–29)
Calcium: 8.9 mg/dL (ref 8.7–10.2)
Chloride: 107 mmol/L — ABNORMAL HIGH (ref 96–106)
Creatinine, Ser: 1.19 mg/dL (ref 0.76–1.27)
Globulin, Total: 2.6 g/dL (ref 1.5–4.5)
Glucose: 97 mg/dL (ref 70–99)
Potassium: 4.4 mmol/L (ref 3.5–5.2)
Sodium: 142 mmol/L (ref 134–144)
Total Protein: 7 g/dL (ref 6.0–8.5)
eGFR: 72 mL/min/{1.73_m2} (ref >59)

## 2023-12-19 LAB — LIPID PANEL
Chol/HDL Ratio: 6.3 ratio — ABNORMAL HIGH (ref 0.0–5.0)
Cholesterol, Total: 247 mg/dL — ABNORMAL HIGH (ref 100–199)
HDL: 39 mg/dL — ABNORMAL LOW (ref >39)
LDL Chol Calc (NIH): 182 mg/dL — ABNORMAL HIGH (ref 0–99)
Triglycerides: 140 mg/dL (ref 0–149)
VLDL Cholesterol Cal: 26 mg/dL (ref 5–40)

## 2023-12-19 LAB — PSA: Prostate Specific Ag, Serum: 2.9 ng/mL (ref 0.0–4.0)

## 2023-12-19 LAB — CBC WITH DIFF/PLATELET
Basophils Absolute: 0.1 10*3/uL (ref 0.0–0.2)
Basos: 1 %
EOS (ABSOLUTE): 0.1 10*3/uL (ref 0.0–0.4)
Eos: 2 %
Hematocrit: 50.6 % (ref 37.5–51.0)
Hemoglobin: 17.1 g/dL (ref 13.0–17.7)
Immature Grans (Abs): 0 10*3/uL (ref 0.0–0.1)
Immature Granulocytes: 0 %
Lymphocytes Absolute: 2.8 10*3/uL (ref 0.7–3.1)
Lymphs: 39 %
MCH: 31 pg (ref 26.6–33.0)
MCHC: 33.8 g/dL (ref 31.5–35.7)
MCV: 92 fL (ref 79–97)
Monocytes Absolute: 0.5 10*3/uL (ref 0.1–0.9)
Monocytes: 7 %
Neutrophils Absolute: 3.6 10*3/uL (ref 1.4–7.0)
Neutrophils: 51 %
Platelets: 147 10*3/uL — ABNORMAL LOW (ref 150–450)
RBC: 5.52 x10E6/uL (ref 4.14–5.80)
RDW: 12.4 % (ref 11.6–15.4)
WBC: 7.1 10*3/uL (ref 3.4–10.8)

## 2023-12-19 MED ORDER — ATORVASTATIN CALCIUM 10 MG PO TABS
10.0000 mg | ORAL_TABLET | Freq: Every day | ORAL | 11 refills | Status: AC
  Filled 2024-01-29: qty 90, 90d supply, fill #0
  Filled 2024-03-17 – 2024-04-10 (×3): qty 90, 90d supply, fill #1
  Filled 2024-07-23: qty 90, 90d supply, fill #2

## 2023-12-19 NOTE — Progress Notes (Signed)
 Established Patient Office Visit  Subjective:  Patient ID: Zachary Chen, male    DOB: 08-30-1966  Age: 58 y.o. MRN: [id number]  No chief complaint on file.   No new complaints, here for lab review and medication refills. Labs reviewed and notable for elevated total and LDL cholesterol. CBC notable for thrombocytopenia and CMP unremarkable except for elevated chloride. Normal PSA.    No other concerns at this time.   No past medical history on file.  Past Surgical History:  Procedure Laterality Date   partial amputation of left finger Left     Social History   Socioeconomic History   Marital status: Married    Spouse name: Not on file   Number of children: Not on file   Years of education: Not on file   Highest education level: Not on file  Occupational History   Occupation: Corporate investment banker  Tobacco Use   Smoking status: Never   Smokeless tobacco: Never  Vaping Use   Vaping status: Never Used  Substance and Sexual Activity   Alcohol use: Yes    Comment: occasionally   Drug use: Never   Sexual activity: Yes  Other Topics Concern   Not on file  Social History Narrative   Not on file   Social Drivers of Health   Financial Resource Strain: Not on file  Food Insecurity: Not on file  Transportation Needs: Not on file  Physical Activity: Not on file  Stress: Not on file  Social Connections: Not on file  Intimate Partner Violence: Not on file    No family history on file.  Not on File  Outpatient Medications Prior to Visit  Medication Sig   diclofenac Sodium (VOLTAREN) 1 % GEL Apply 2 g topically 2 (two) times daily. To both shoulders   No facility-administered medications prior to visit.    Review of Systems  Constitutional:  Positive for weight loss. Negative for malaise/fatigue.  HENT: Negative.    Eyes:  Negative for blurred vision (except near vision).  Respiratory: Negative.    Cardiovascular: Negative.  Negative for chest pain.   Gastrointestinal: Negative.   Genitourinary: Negative.   Musculoskeletal:  Positive for joint pain (both shoulders).  Skin:        Skin tags  Neurological: Negative.   Endo/Heme/Allergies:  Does not bruise/bleed easily.  Psychiatric/Behavioral: Negative.         Objective:   BP 122/78   Pulse 75   Temp 98.5 F (36.9 C)   Ht 5\' 6"  (1.676 m)   Wt 179 lb (81.2 kg)   SpO2 98%   BMI 28.89 kg/m   Vitals:   12/19/23 1358  BP: 122/78  Pulse: 75  Temp: 98.5 F (36.9 C)  Height: 5\' 6"  (1.676 m)  Weight: 179 lb (81.2 kg)  SpO2: 98%  BMI (Calculated): 28.91    Physical Exam Vitals reviewed.  Constitutional:      Appearance: Normal appearance.  HENT:     Head: Normocephalic.     Left Ear: There is no impacted cerumen.     Nose: Nose normal.     Mouth/Throat:     Mouth: Mucous membranes are moist.     Pharynx: No posterior oropharyngeal erythema.  Eyes:     Extraocular Movements: Extraocular movements intact.     Pupils: Pupils are equal, round, and reactive to light.  Cardiovascular:     Rate and Rhythm: Regular rhythm.     Chest Wall: PMI is not displaced.  Pulses: Normal pulses.     Heart sounds: Normal heart sounds. No murmur heard. Pulmonary:     Effort: Pulmonary effort is normal.     Breath sounds: Normal air entry. No rhonchi or rales.  Abdominal:     General: Abdomen is flat. Bowel sounds are normal. There is no distension.     Palpations: Abdomen is soft. There is no hepatomegaly, splenomegaly or mass.     Tenderness: There is no abdominal tenderness.  Musculoskeletal:        General: Normal range of motion.     Cervical back: Normal range of motion and neck supple.     Right lower leg: No edema.     Left lower leg: No edema.  Skin:    General: Skin is warm and dry.     Comments: Onychomycosis of left big toenail  Neurological:     General: No focal deficit present.     Mental Status: He is alert and oriented to person, place, and time.      Cranial Nerves: No cranial nerve deficit.     Motor: No weakness.     Deep Tendon Reflexes: Reflexes are normal and symmetric.  Psychiatric:        Mood and Affect: Mood normal.        Behavior: Behavior normal.      No results found for any visits on 12/19/23.  Recent Results (from the past 2160 hours)  Lipid panel     Status: Abnormal   Collection Time: 12/18/23  8:13 AM  Result Value Ref Range   Cholesterol, Total 247 (H) 100 - 199 mg/dL   Triglycerides 161 0 - 149 mg/dL   HDL 39 (L) >09 mg/dL   VLDL Cholesterol Cal 26 5 - 40 mg/dL   LDL Chol Calc (NIH) 604 (H) 0 - 99 mg/dL   Chol/HDL Ratio 6.3 (H) 0.0 - 5.0 ratio    Comment:                                   T. Chol/HDL Ratio                                             Men  Women                               1/2 Avg.Risk  3.4    3.3                                   Avg.Risk  5.0    4.4                                2X Avg.Risk  9.6    7.1                                3X Avg.Risk 23.4   11.0   Comprehensive metabolic panel     Status: Abnormal   Collection Time: 12/18/23  8:13 AM  Result Value Ref Range   Glucose 97 70 -  99 mg/dL   BUN 20 6 - 24 mg/dL   Creatinine, Ser 1.61 0.76 - 1.27 mg/dL   eGFR 72 >09 UE/AVW/0.98   BUN/Creatinine Ratio 17 9 - 20   Sodium 142 134 - 144 mmol/L   Potassium 4.4 3.5 - 5.2 mmol/L   Chloride 107 (H) 96 - 106 mmol/L   CO2 22 20 - 29 mmol/L   Calcium 8.9 8.7 - 10.2 mg/dL   Total Protein 7.0 6.0 - 8.5 g/dL   Albumin 4.4 3.8 - 4.9 g/dL   Globulin, Total 2.6 1.5 - 4.5 g/dL   Bilirubin Total 0.6 0.0 - 1.2 mg/dL   Alkaline Phosphatase 67 44 - 121 IU/L   AST 29 0 - 40 IU/L   ALT 29 0 - 44 IU/L  CBC With Diff/Platelet     Status: Abnormal   Collection Time: 12/18/23  8:13 AM  Result Value Ref Range   WBC 7.1 3.4 - 10.8 x10E3/uL   RBC 5.52 4.14 - 5.80 x10E6/uL   Hemoglobin 17.1 13.0 - 17.7 g/dL   Hematocrit 11.9 14.7 - 51.0 %   MCV 92 79 - 97 fL   MCH 31.0 26.6 - 33.0 pg   MCHC  33.8 31.5 - 35.7 g/dL   RDW 82.9 56.2 - 13.0 %   Platelets 147 (L) 150 - 450 x10E3/uL   Neutrophils 51 Not Estab. %   Lymphs 39 Not Estab. %   Monocytes 7 Not Estab. %   Eos 2 Not Estab. %   Basos 1 Not Estab. %   Neutrophils Absolute 3.6 1.4 - 7.0 x10E3/uL   Lymphocytes Absolute 2.8 0.7 - 3.1 x10E3/uL   Monocytes Absolute 0.5 0.1 - 0.9 x10E3/uL   EOS (ABSOLUTE) 0.1 0.0 - 0.4 x10E3/uL   Basophils Absolute 0.1 0.0 - 0.2 x10E3/uL   Immature Granulocytes 0 Not Estab. %   Immature Grans (Abs) 0.0 0.0 - 0.1 x10E3/uL  PSA     Status: None   Collection Time: 12/18/23  8:13 AM  Result Value Ref Range   Prostate Specific Ag, Serum 2.9 0.0 - 4.0 ng/mL    Comment: Roche ECLIA methodology. According to the American Urological Association, Serum PSA should decrease and remain at undetectable levels after radical prostatectomy. The AUA defines biochemical recurrence as an initial PSA value 0.2 ng/mL or greater followed by a subsequent confirmatory PSA value 0.2 ng/mL or greater. Values obtained with different assay methods or kits cannot be used interchangeably. Results cannot be interpreted as absolute evidence of the presence or absence of malignant disease.       Assessment & Plan:  As per problem list. Repeat CBC in 3 mo, recommend starting statin due to ASCVD score of 8. Problem List Items Addressed This Visit       Hematopoietic and Hemostatic   Thrombocytopenia (HCC)   Relevant Orders   CBC with Diff     Other   Pure hypercholesterolemia - Primary   Relevant Medications   atorvastatin (LIPITOR) 10 MG tablet   Other Relevant Orders   Lipid Profile   Liver Profile   CK    Return in about 3 months (around 03/20/2024) for fu with labs prior.   Total time spent: 20 minutes  Luna Fuse, MD  12/19/2023   This document may have been prepared by [facility] Voice Recognition software and as such may include unintentional dictation errors.

## 2024-01-29 ENCOUNTER — Other Ambulatory Visit: Payer: Self-pay

## 2024-03-18 ENCOUNTER — Other Ambulatory Visit: Payer: Self-pay

## 2024-03-26 ENCOUNTER — Ambulatory Visit: Admitting: Internal Medicine

## 2024-04-05 ENCOUNTER — Other Ambulatory Visit: Payer: Self-pay

## 2024-04-05 ENCOUNTER — Encounter: Payer: Self-pay | Admitting: Internal Medicine

## 2024-04-05 ENCOUNTER — Ambulatory Visit: Admitting: Internal Medicine

## 2024-04-05 DIAGNOSIS — E7800 Pure hypercholesterolemia, unspecified: Secondary | ICD-10-CM | POA: Diagnosis not present

## 2024-04-05 DIAGNOSIS — M25511 Pain in right shoulder: Secondary | ICD-10-CM

## 2024-04-05 DIAGNOSIS — M25512 Pain in left shoulder: Secondary | ICD-10-CM | POA: Diagnosis not present

## 2024-04-05 DIAGNOSIS — Z0130 Encounter for examination of blood pressure without abnormal findings: Secondary | ICD-10-CM

## 2024-04-05 NOTE — Progress Notes (Signed)
 Established Patient Office Visit  Subjective:  Patient ID: Zachary Chen, male    DOB: 1966/09/12  Age: 58 y.o. MRN: [id number]  Chief Complaint  Patient presents with   Follow-up    Follow up for colostomy appointment    No new complaints, here for lab review and medication refills.Failed to have previsit labs done and failed to keep GI appt.   No other concerns at this time.   No past medical history on file.  Past Surgical History:  Procedure Laterality Date   partial amputation of left finger Left     Social History   Socioeconomic History   Marital status: Married    Spouse name: Not on file   Number of children: Not on file   Years of education: Not on file   Highest education level: Not on file  Occupational History   Occupation: Corporate investment banker  Tobacco Use   Smoking status: Never   Smokeless tobacco: Never  Vaping Use   Vaping status: Never Used  Substance and Sexual Activity   Alcohol use: Yes    Comment: occasionally   Drug use: Never   Sexual activity: Yes  Other Topics Concern   Not on file  Social History Narrative   Not on file   Social Drivers of Health   Financial Resource Strain: Not on file  Food Insecurity: Not on file  Transportation Needs: Not on file  Physical Activity: Not on file  Stress: Not on file  Social Connections: Not on file  Intimate Partner Violence: Not on file    No family history on file.  Not on File  Outpatient Medications Prior to Visit  Medication Sig   atorvastatin  (LIPITOR) 10 MG tablet Take 1 tablet (10 mg total) by mouth daily.   No facility-administered medications prior to visit.    Review of Systems  Constitutional:  Negative for malaise/fatigue and weight loss (gained 3 lbs).  HENT: Negative.    Eyes:  Negative for blurred vision (except near vision).  Respiratory: Negative.    Cardiovascular: Negative.  Negative for chest pain.  Gastrointestinal: Negative.   Genitourinary: Negative.    Musculoskeletal:  Positive for joint pain (both shoulders).  Skin:        Skin tags  Neurological: Negative.   Endo/Heme/Allergies:  Does not bruise/bleed easily.  Psychiatric/Behavioral: Negative.         Objective:   BP 110/70   Pulse 66   Temp (!) 96.7 F (35.9 C)   Ht 5' 6 (1.676 m)   Wt 182 lb 6.4 oz (82.7 kg)   SpO2 95%   BMI 29.44 kg/m   Vitals:   04/05/24 1505  BP: 110/70  Pulse: 66  Temp: (!) 96.7 F (35.9 C)  Height: 5' 6 (1.676 m)  Weight: 182 lb 6.4 oz (82.7 kg)  SpO2: 95%  BMI (Calculated): 29.45    Physical Exam Vitals reviewed.  Constitutional:      Appearance: Normal appearance.  HENT:     Head: Normocephalic.     Left Ear: There is no impacted cerumen.     Nose: Nose normal.     Mouth/Throat:     Mouth: Mucous membranes are moist.     Pharynx: No posterior oropharyngeal erythema.   Eyes:     Extraocular Movements: Extraocular movements intact.     Pupils: Pupils are equal, round, and reactive to light.    Cardiovascular:     Rate and Rhythm: Regular rhythm.  Chest Wall: PMI is not displaced.     Pulses: Normal pulses.     Heart sounds: Normal heart sounds. No murmur heard. Pulmonary:     Effort: Pulmonary effort is normal.     Breath sounds: Normal air entry. No rhonchi or rales.  Abdominal:     General: Abdomen is flat. Bowel sounds are normal. There is no distension.     Palpations: Abdomen is soft. There is no hepatomegaly, splenomegaly or mass.     Tenderness: There is no abdominal tenderness.   Musculoskeletal:        General: Normal range of motion.     Cervical back: Normal range of motion and neck supple.     Right lower leg: No edema.     Left lower leg: No edema.   Skin:    General: Skin is warm and dry.     Comments: Onychomycosis of left big toenail   Neurological:     General: No focal deficit present.     Mental Status: He is alert and oriented to person, place, and time.     Cranial Nerves: No cranial  nerve deficit.     Motor: No weakness.     Deep Tendon Reflexes: Reflexes are normal and symmetric.   Psychiatric:        Mood and Affect: Mood normal.        Behavior: Behavior normal.      No results found for any visits on 04/05/24.  No results found for this or any previous visit (from the past 2160 hours).    Assessment & Plan:  As per problem list. Reschedule GI appt Problem List Items Addressed This Visit       Other   Pure hypercholesterolemia   Relevant Orders   Lipid panel    Return in about 2 weeks (around 04/19/2024) for fu with labs prior.   Total time spent: 20 minutes  Arzella Bitters, MD  04/05/2024   This document may have been prepared by [facility] Voice Recognition software and as such may include unintentional dictation errors.

## 2024-04-17 ENCOUNTER — Ambulatory Visit: Admitting: Internal Medicine

## 2024-05-03 ENCOUNTER — Other Ambulatory Visit: Payer: Self-pay | Admitting: Internal Medicine

## 2024-05-03 ENCOUNTER — Other Ambulatory Visit

## 2024-05-03 DIAGNOSIS — E7800 Pure hypercholesterolemia, unspecified: Secondary | ICD-10-CM | POA: Diagnosis not present

## 2024-05-04 LAB — LIPID PANEL
Chol/HDL Ratio: 4.8 ratio (ref 0.0–5.0)
Cholesterol, Total: 145 mg/dL (ref 100–199)
HDL: 30 mg/dL — ABNORMAL LOW (ref >39)
LDL Chol Calc (NIH): 95 mg/dL (ref 0–99)
Triglycerides: 108 mg/dL (ref 0–149)
VLDL Cholesterol Cal: 20 mg/dL (ref 5–40)

## 2024-05-04 LAB — CK: Total CK: 489 U/L — ABNORMAL HIGH (ref 41–331)

## 2024-05-13 ENCOUNTER — Ambulatory Visit: Payer: Self-pay | Admitting: Internal Medicine

## 2024-05-13 ENCOUNTER — Ambulatory Visit: Admitting: Internal Medicine

## 2024-05-13 ENCOUNTER — Encounter: Payer: Self-pay | Admitting: Internal Medicine

## 2024-05-13 VITALS — BP 118/74 | HR 65 | Temp 98.2°F | Ht 66.0 in | Wt 183.0 lb

## 2024-05-13 DIAGNOSIS — M25512 Pain in left shoulder: Secondary | ICD-10-CM

## 2024-05-13 DIAGNOSIS — M25511 Pain in right shoulder: Secondary | ICD-10-CM | POA: Diagnosis not present

## 2024-05-13 DIAGNOSIS — E7800 Pure hypercholesterolemia, unspecified: Secondary | ICD-10-CM | POA: Diagnosis not present

## 2024-05-13 DIAGNOSIS — Z0130 Encounter for examination of blood pressure without abnormal findings: Secondary | ICD-10-CM

## 2024-05-13 NOTE — Progress Notes (Signed)
 Established Patient Office Visit  Subjective:  Patient ID: Zachary Chen, male    DOB: Jul 10, 1966  Age: 58 y.o. MRN: [id number]  Chief Complaint  Patient presents with   Follow-up    2 weeks lab results     No new complaints, here for lab review and medication refills. Tremendous improvement in LDL and TC now well controlled on lab review. Triglycerides also satisfactory.     No other concerns at this time.   No past medical history on file.  Past Surgical History:  Procedure Laterality Date   partial amputation of left finger Left     Social History   Socioeconomic History   Marital status: Married    Spouse name: Not on file   Number of children: Not on file   Years of education: Not on file   Highest education level: Not on file  Occupational History   Occupation: Corporate investment banker  Tobacco Use   Smoking status: Never   Smokeless tobacco: Never  Vaping Use   Vaping status: Never Used  Substance and Sexual Activity   Alcohol use: Yes    Comment: occasionally   Drug use: Never   Sexual activity: Yes  Other Topics Concern   Not on file  Social History Narrative   Not on file   Social Drivers of Health   Financial Resource Strain: Not on file  Food Insecurity: Not on file  Transportation Needs: Not on file  Physical Activity: Not on file  Stress: Not on file  Social Connections: Not on file  Intimate Partner Violence: Not on file    No family history on file.  Not on File  Outpatient Medications Prior to Visit  Medication Sig   atorvastatin  (LIPITOR) 10 MG tablet Take 1 tablet (10 mg total) by mouth daily.   No facility-administered medications prior to visit.    Review of Systems  Constitutional:  Negative for malaise/fatigue and weight loss (gained 3 lbs).  HENT: Negative.    Eyes:  Negative for blurred vision (except near vision).  Respiratory: Negative.    Cardiovascular: Negative.  Negative for chest pain.  Gastrointestinal:  Negative.   Genitourinary: Negative.   Musculoskeletal:  Positive for joint pain (both shoulders is improving).  Skin:        Skin tags  Neurological: Negative.   Endo/Heme/Allergies:  Does not bruise/bleed easily.  Psychiatric/Behavioral: Negative.         Objective:   BP 118/74   Pulse 65   Temp 98.2 F (36.8 C) (Tympanic)   Ht 5' 6 (1.676 m)   Wt 183 lb (83 kg)   SpO2 95%   BMI 29.54 kg/m   Vitals:   05/13/24 1045  BP: 118/74  Pulse: 65  Temp: 98.2 F (36.8 C)  Height: 5' 6 (1.676 m)  Weight: 183 lb (83 kg)  SpO2: 95%  TempSrc: Tympanic  BMI (Calculated): 29.55    Physical Exam Vitals reviewed.  Constitutional:      Appearance: Normal appearance.  HENT:     Head: Normocephalic.     Left Ear: There is no impacted cerumen.     Nose: Nose normal.     Mouth/Throat:     Mouth: Mucous membranes are moist.     Pharynx: No posterior oropharyngeal erythema.  Eyes:     Extraocular Movements: Extraocular movements intact.     Pupils: Pupils are equal, round, and reactive to light.  Cardiovascular:     Rate and Rhythm: Regular rhythm.  Chest Wall: PMI is not displaced.     Pulses: Normal pulses.     Heart sounds: Normal heart sounds. No murmur heard. Pulmonary:     Effort: Pulmonary effort is normal.     Breath sounds: Normal air entry. No rhonchi or rales.  Abdominal:     General: Abdomen is flat. Bowel sounds are normal. There is no distension.     Palpations: Abdomen is soft. There is no hepatomegaly, splenomegaly or mass.     Tenderness: There is no abdominal tenderness.  Musculoskeletal:        General: Normal range of motion.     Cervical back: Normal range of motion and neck supple.     Right lower leg: No edema.     Left lower leg: No edema.  Skin:    General: Skin is warm and dry.     Comments: Onychomycosis of left big toenail  Neurological:     General: No focal deficit present.     Mental Status: He is alert and oriented to person,  place, and time.     Cranial Nerves: No cranial nerve deficit.     Motor: No weakness.     Deep Tendon Reflexes: Reflexes are normal and symmetric.  Psychiatric:        Mood and Affect: Mood normal.        Behavior: Behavior normal.      No results found for any visits on 05/13/24.  Recent Results (from the past 2160 hours)  Lipid panel     Status: Abnormal   Collection Time: 05/03/24  8:28 AM  Result Value Ref Range   Cholesterol, Total 145 100 - 199 mg/dL   Triglycerides 891 0 - 149 mg/dL   HDL 30 (L) >60 mg/dL   VLDL Cholesterol Cal 20 5 - 40 mg/dL   LDL Chol Calc (NIH) 95 0 - 99 mg/dL   Chol/HDL Ratio 4.8 0.0 - 5.0 ratio    Comment:                                   T. Chol/HDL Ratio                                             Men  Women                               1/2 Avg.Risk  3.4    3.3                                   Avg.Risk  5.0    4.4                                2X Avg.Risk  9.6    7.1                                3X Avg.Risk 23.4   11.0   CK     Status: Abnormal   Collection Time: 05/03/24  8:28 AM  Result Value Ref Range  Total CK 489 (H) 41 - 331 U/L      Assessment & Plan:  As per problem list. The current medical regimen is effective;  continue present plan and medications.   Problem List Items Addressed This Visit       Other   Pain of both shoulder joints   Pure hypercholesterolemia - Primary   Relevant Orders   Lipid panel   Hepatic function panel   CK    Return in about 3 months (around 08/13/2024) for fu with labs prior.   Total time spent: 20 minutes  Sherrill Cinderella Perry, MD  05/13/2024   This document may have been prepared by [facility] Voice Recognition software and as such may include unintentional dictation errors.

## 2024-08-20 ENCOUNTER — Ambulatory Visit: Admitting: Internal Medicine

## 2024-09-23 ENCOUNTER — Other Ambulatory Visit: Payer: Self-pay

## 2024-09-23 ENCOUNTER — Ambulatory Visit: Payer: Self-pay | Admitting: Internal Medicine

## 2024-09-23 DIAGNOSIS — Z1211 Encounter for screening for malignant neoplasm of colon: Secondary | ICD-10-CM

## 2024-09-23 LAB — POC HEMOCCULT BLD/STL (HOME/3-CARD/SCREEN)
Card #2 Fecal Occult Blod, POC: POSITIVE
Card #3 Fecal Occult Blood, POC: POSITIVE
Fecal Occult Blood, POC: NEGATIVE

## 2024-09-26 ENCOUNTER — Other Ambulatory Visit

## 2024-09-26 DIAGNOSIS — E7800 Pure hypercholesterolemia, unspecified: Secondary | ICD-10-CM

## 2024-09-27 LAB — LIPID PANEL
Chol/HDL Ratio: 5.3 ratio — ABNORMAL HIGH (ref 0.0–5.0)
Cholesterol, Total: 196 mg/dL (ref 100–199)
HDL: 37 mg/dL — ABNORMAL LOW (ref >39)
LDL Chol Calc (NIH): 136 mg/dL — ABNORMAL HIGH (ref 0–99)
Triglycerides: 126 mg/dL (ref 0–149)
VLDL Cholesterol Cal: 23 mg/dL (ref 5–40)

## 2024-09-27 LAB — CK: Total CK: 127 U/L (ref 41–331)

## 2024-09-30 ENCOUNTER — Ambulatory Visit: Payer: Self-pay | Admitting: Internal Medicine

## 2024-09-30 ENCOUNTER — Ambulatory Visit: Admitting: Internal Medicine

## 2024-09-30 ENCOUNTER — Encounter: Payer: Self-pay | Admitting: Internal Medicine

## 2024-09-30 VITALS — BP 119/83 | HR 90 | Temp 98.1°F | Ht 66.0 in | Wt 184.6 lb

## 2024-09-30 DIAGNOSIS — E7800 Pure hypercholesterolemia, unspecified: Secondary | ICD-10-CM | POA: Diagnosis not present

## 2024-09-30 DIAGNOSIS — K922 Gastrointestinal hemorrhage, unspecified: Secondary | ICD-10-CM

## 2024-09-30 NOTE — Progress Notes (Signed)
 Established Patient Office Visit  Subjective:  Patient ID: Zachary Chen, male    DOB: October 14, 1966  Age: 58 y.o. MRN: [id number]  Chief Complaint  Patient presents with   Acute Visit    Colon Screening    Here for positive occult stool test. Was referred to GI 11 mo ago for colonoscopy but never kept his appt. Labs reviewed and notable for deterioration in elevated LDL to which he admits poor med compliance.    No other concerns at this time.   No past medical history on file.  Past Surgical History:  Procedure Laterality Date   partial amputation of left finger Left     Social History   Socioeconomic History   Marital status: Married    Spouse name: Not on file   Number of children: Not on file   Years of education: Not on file   Highest education level: Not on file  Occupational History   Occupation: corporate investment banker  Tobacco Use   Smoking status: Never   Smokeless tobacco: Never  Vaping Use   Vaping status: Never Used  Substance and Sexual Activity   Alcohol use: Yes    Comment: occasionally   Drug use: Never   Sexual activity: Yes  Other Topics Concern   Not on file  Social History Narrative   Not on file   Social Drivers of Health   Tobacco Use: Low Risk (09/30/2024)   Patient History    Smoking Tobacco Use: Never    Smokeless Tobacco Use: Never    Passive Exposure: Not on file  Financial Resource Strain: Not on file  Food Insecurity: Not on file  Transportation Needs: Not on file  Physical Activity: Not on file  Stress: Not on file  Social Connections: Not on file  Intimate Partner Violence: Not on file  Depression (PHQ2-9): Low Risk (11/30/2023)   Depression (PHQ2-9)    PHQ-2 Score: 0  Alcohol Screen: Not on file  Housing: Unknown (11/28/2023)   Received from [facility]   Epic    Unable to Pay for Housing in the Last Year: Not on file    Number of Times Moved in the Last Year: Not on file    At  any time in the past 12 months, were you homeless or living in a shelter (including now)?: No  Utilities: Not on file  Health Literacy: Not on file    No family history on file.  Allergies[1]  Show/hide medication list[2]  Review of Systems  Constitutional:  Negative for malaise/fatigue and weight loss (gained 2 lbs).  HENT: Negative.    Eyes:  Negative for blurred vision (except near vision).  Respiratory: Negative.    Cardiovascular: Negative.  Negative for chest pain.  Gastrointestinal: Negative.   Genitourinary: Negative.   Musculoskeletal:  Positive for joint pain (both shoulders is improving).  Skin:        Skin tags  Neurological: Negative.   Endo/Heme/Allergies:  Does not bruise/bleed easily.  Psychiatric/Behavioral: Negative.         Objective:   BP 119/83   Pulse 90   Temp 98.1 F (36.7 C)   Ht 5' 6 (1.676 m)   Wt 184 lb 9.6 oz (83.7 kg)   SpO2 94%   BMI 29.80 kg/m   Vitals:   09/30/24 1509  BP: 119/83  Pulse: 90  Temp: 98.1 F (36.7 C)  Height: 5' 6 (1.676 m)  Weight: 184 lb 9.6 oz (83.7 kg)  SpO2:  94%  BMI (Calculated): 29.81    Physical Exam Vitals reviewed.  Constitutional:      Appearance: Normal appearance.  HENT:     Head: Normocephalic.     Left Ear: There is no impacted cerumen.     Nose: Nose normal.     Mouth/Throat:     Mouth: Mucous membranes are moist.     Pharynx: No posterior oropharyngeal erythema.  Eyes:     Extraocular Movements: Extraocular movements intact.     Pupils: Pupils are equal, round, and reactive to light.  Cardiovascular:     Rate and Rhythm: Regular rhythm.     Chest Wall: PMI is not displaced.     Pulses: Normal pulses.     Heart sounds: Normal heart sounds. No murmur heard. Pulmonary:     Effort: Pulmonary effort is normal.     Breath sounds: Normal air entry. No rhonchi or rales.  Abdominal:     General: Abdomen is flat. Bowel sounds are normal. There is no distension.     Palpations: Abdomen  is soft. There is no hepatomegaly, splenomegaly or mass.     Tenderness: There is no abdominal tenderness.  Musculoskeletal:        General: Normal range of motion.     Cervical back: Normal range of motion and neck supple.     Right lower leg: No edema.     Left lower leg: No edema.  Skin:    General: Skin is warm and dry.     Comments: Onychomycosis of left big toenail  Neurological:     General: No focal deficit present.     Mental Status: He is alert and oriented to person, place, and time.     Cranial Nerves: No cranial nerve deficit.     Motor: No weakness.     Deep Tendon Reflexes: Reflexes are normal and symmetric.  Psychiatric:        Mood and Affect: Mood normal.        Behavior: Behavior normal.      No results found for any visits on 09/30/24.  Recent Results (from the past 2160 hours)  POC Hemoccult Bld/Stl (3-Cd Home Screen)     Status: Abnormal   Collection Time: 09/23/24  8:49 AM  Result Value Ref Range   Card #1 Date 09/04/24    Fecal Occult Blood, POC Negative Negative   Card #2 Date 09/11/24    Card #2 Fecal Occult Blod, POC Positive    Card #3 Date 09/13/24    Card #3 Fecal Occult Blood, POC Positive   CK     Status: None   Collection Time: 09/26/24  8:37 AM  Result Value Ref Range   Total CK 127 41 - 331 U/L  Lipid panel     Status: Abnormal   Collection Time: 09/26/24  8:37 AM  Result Value Ref Range   Cholesterol, Total 196 100 - 199 mg/dL   Triglycerides 873 0 - 149 mg/dL   HDL 37 (L) >60 mg/dL   VLDL Cholesterol Cal 23 5 - 40 mg/dL   LDL Chol Calc (NIH) 863 (H) 0 - 99 mg/dL   Chol/HDL Ratio 5.3 (H) 0.0 - 5.0 ratio    Comment:                                   T. Chol/HDL Ratio  Men  Women                               1/2 Avg.Risk  3.4    3.3                                   Avg.Risk  5.0    4.4                                2X Avg.Risk  9.6    7.1                                3X Avg.Risk 23.4    11.0       Assessment & Plan:  Jadin was seen today for acute visit.  Gastrointestinal hemorrhage, unspecified gastrointestinal hemorrhage type  Pure hypercholesterolemia   Keep appt with GI. Problem List Items Addressed This Visit       Other   Pure hypercholesterolemia   Other Visit Diagnoses       Gastrointestinal hemorrhage, unspecified gastrointestinal hemorrhage type    -  Primary       No follow-ups on file.   Total time spent: 20 minutes. This time includes review of previous notes and results and patient face to face interaction during today'Abigale Dorow visit.    Sherrill Cinderella Perry, MD  09/30/2024   This document may have been prepared by [facility] Voice Recognition software and as such may include unintentional dictation errors.      [1] Not on File [2] Outpatient Medications Prior to Visit  Medication Sig   atorvastatin  (LIPITOR) 10 MG tablet Take 1 tablet (10 mg total) by mouth daily.   No facility-administered medications prior to visit.

## 2024-10-23 ENCOUNTER — Other Ambulatory Visit: Payer: Self-pay

## 2024-10-29 ENCOUNTER — Other Ambulatory Visit: Payer: Self-pay

## 2024-10-29 MED ORDER — NA SULFATE-K SULFATE-MG SULF 17.5-3.13-1.6 GM/177ML PO SOLN
ORAL | 0 refills | Status: AC
  Filled 2024-10-29: qty 354, 1d supply, fill #0

## 2024-11-13 ENCOUNTER — Ambulatory Visit

## 2024-12-30 ENCOUNTER — Ambulatory Visit: Admitting: Internal Medicine
